# Patient Record
Sex: Male | Born: 1955 | Race: White | Hispanic: No | Marital: Married | Smoking: Never smoker
Health system: Southern US, Community
[De-identification: ages and names within clinical notes are randomized; demographics above are authoritative.]

---

## 2010-05-20 ENCOUNTER — Ambulatory Visit (HOSPITAL_COMMUNITY)
Admission: RE | Admit: 2010-05-20 | Discharge: 2010-05-20 | Disposition: A | Discharge status: Home / Self Care | Payer: 59 | Source: Ambulatory Visit | Attending: Orthopedic Surgery | Admitting: Orthopedic Surgery

## 2010-05-20 ENCOUNTER — Other Ambulatory Visit (HOSPITAL_COMMUNITY): Payer: Self-pay | Admitting: Orthopedic Surgery

## 2010-05-20 ENCOUNTER — Encounter (HOSPITAL_COMMUNITY)
Admission: RE | Admit: 2010-05-20 | Discharge: 2010-05-20 | Disposition: A | Discharge status: Home / Self Care | Payer: 59 | Source: Ambulatory Visit | Attending: Orthopedic Surgery | Admitting: Orthopedic Surgery

## 2010-05-20 DIAGNOSIS — Z96659 Presence of unspecified artificial knee joint: Secondary | ICD-10-CM

## 2010-05-20 DIAGNOSIS — Z01818 Encounter for other preprocedural examination: Secondary | ICD-10-CM | POA: Insufficient documentation

## 2010-05-20 LAB — PROTIME-INR
INR: 0.95 (ref 0.00–1.49)
Prothrombin Time: 12.9 seconds (ref 11.6–15.2)

## 2010-05-20 LAB — DIFFERENTIAL
Basophils Absolute: 0 10*3/uL (ref 0.0–0.1)
Basophils Relative: 1 % (ref 0–1)
Eosinophils Absolute: 0.1 10*3/uL (ref 0.0–0.7)
Eosinophils Relative: 1 % (ref 0–5)
Lymphs Abs: 1.3 10*3/uL (ref 0.7–4.0)
Neutrophils Relative %: 71 % (ref 43–77)

## 2010-05-20 LAB — URINALYSIS, ROUTINE W REFLEX MICROSCOPIC
Ketones, ur: NEGATIVE mg/dL
Nitrite: NEGATIVE
Protein, ur: NEGATIVE mg/dL
Urine Glucose, Fasting: NEGATIVE mg/dL

## 2010-05-20 LAB — COMPREHENSIVE METABOLIC PANEL
AST: 23 U/L (ref 0–37)
Alkaline Phosphatase: 88 U/L (ref 39–117)
BUN: 14 mg/dL (ref 6–23)
CO2: 32 mEq/L (ref 19–32)
Chloride: 101 mEq/L (ref 96–112)
Creatinine, Ser: 1.08 mg/dL (ref 0.4–1.5)
GFR calc non Af Amer: >60 mL/min (ref >60)
Potassium: 4.1 mEq/L (ref 3.5–5.1)
Total Bilirubin: 1.2 mg/dL (ref 0.3–1.2)

## 2010-05-20 LAB — CBC
Platelets: 190 10*3/uL (ref 150–400)
RBC: 4.67 MIL/uL (ref 4.22–5.81)
RDW: 13.4 % (ref 11.5–15.5)
WBC: 6.7 10*3/uL (ref 4.0–10.5)

## 2010-05-21 LAB — URINE CULTURE: Culture  Setup Time: 201202141533

## 2010-05-26 ENCOUNTER — Inpatient Hospital Stay (HOSPITAL_COMMUNITY)
Admission: RE | Admit: 2010-05-26 | Discharge: 2010-05-29 | DRG: 462 | Disposition: A | Discharge status: Home Health | Payer: 59 | Source: Ambulatory Visit | Attending: Orthopedic Surgery | Admitting: Orthopedic Surgery

## 2010-05-26 DIAGNOSIS — D62 Acute posthemorrhagic anemia: Secondary | ICD-10-CM | POA: Diagnosis not present

## 2010-05-26 DIAGNOSIS — K5900 Constipation, unspecified: Secondary | ICD-10-CM | POA: Diagnosis not present

## 2010-05-26 DIAGNOSIS — M1710 Unilateral primary osteoarthritis, unspecified knee: Principal | ICD-10-CM | POA: Diagnosis present

## 2010-05-26 DIAGNOSIS — K219 Gastro-esophageal reflux disease without esophagitis: Secondary | ICD-10-CM | POA: Diagnosis not present

## 2010-05-26 DIAGNOSIS — Z79899 Other long term (current) drug therapy: Secondary | ICD-10-CM

## 2010-05-26 DIAGNOSIS — Z472 Encounter for removal of internal fixation device: Secondary | ICD-10-CM

## 2010-05-26 LAB — ABO/RH: ABO/RH(D): O NEG

## 2010-05-26 LAB — GRAM STAIN

## 2010-05-27 LAB — BASIC METABOLIC PANEL
BUN: 13 mg/dL (ref 6–23)
CO2: 29 mEq/L (ref 19–32)
Chloride: 101 mEq/L (ref 96–112)
GFR calc non Af Amer: >60 mL/min (ref >60)
Glucose, Bld: 123 mg/dL — ABNORMAL HIGH (ref 70–99)
Potassium: 4.1 mEq/L (ref 3.5–5.1)
Sodium: 135 mEq/L (ref 135–145)

## 2010-05-27 LAB — CBC
HCT: 28.9 % — ABNORMAL LOW (ref 39.0–52.0)
Hemoglobin: 9.8 g/dL — ABNORMAL LOW (ref 13.0–17.0)
MCH: 31.1 pg (ref 26.0–34.0)
MCHC: 33.9 g/dL (ref 30.0–36.0)
MCV: 91.7 fL (ref 78.0–100.0)
RBC: 3.15 MIL/uL — ABNORMAL LOW (ref 4.22–5.81)

## 2010-05-27 NOTE — Op Note (Signed)
NAMEFRANCO, DULEY               ACCOUNT NO.:  [id number]  MEDICAL RECORD NO.:  [id number]           PATIENT TYPE:  I  LOCATION:  3307                         FACILITY:  [facility]  PHYSICIAN:  Elana Alm. Thurston Hole, M.D. DATE OF BIRTH:  Sep 22, 1955  DATE OF PROCEDURE:  05/26/2010 DATE OF DISCHARGE:                              OPERATIVE REPORT   PREOPERATIVE DIAGNOSIS:  Left knee degenerative joint disease.  POSTOPERATIVE DIAGNOSIS:  Left knee degenerative joint disease.  PROCEDURE: 1. Left total knee replacement using DePuy cemented total knee system     with #5 cemented femur, #5 cemented tibia with 12.5 mm polyethylene     RP tibial spacer, and 38-mm polyethylene cemented patella. 2. Zinacef-impregnated cement.  SURGEON:  Elana Alm. Thurston Hole, M.D.  ASSISTANT:  Feliberto Gottron. Turner Daniels, M.D. and Harvey Cedars PA.  ANESTHESIA:  General.  OPERATIVE TIME:  1 hour and 30 minutes, left knee.  COMPLICATIONS:  None.  DESCRIPTION OF PROCEDURE:  Mr. Plantz was brought to the operating room on May 26, 2010, after epidural anesthetic had been placed by Anesthesia in the holding room.  He was placed on the operative table in supine position.  After being placed under general anesthesia, both knees were examined under anesthesia.  He had range of motion from minus 10 to 105 degrees on the right and from minus 8 to 110 degrees on the left.  Knees were stable ligamentous exam with normal patellar tracking. He had a Foley catheter placed under sterile conditions and received Ancef 1 g IV preoperatively for prophylaxis.  Both legs were then prepped using sterile DuraPrep and draped using sterile technique.  Time- out procedure was called, and both right and left knees were identified as the operative correct knees.  Originally, the right knee underwent total knee replacement by Dr. Gean Birchwood, and I assisted Dr. Turner Daniels on the right knee, and he will dictate this operative note separately. After  the right knee total knee replacement was completed, the tourniquet was released.  Hemostasis was obtained with cautery and the incisions were closed.  Then, we proceeded with the left total knee replacement.  Left leg was exsanguinated and a thigh tourniquet elevated to 365 mm. Initially, through a 15-cm longitudinal incision based over the patella, initial exposure was made.  The underlying subcutaneous tissues were incised along with skin incision.  A median arthrotomy was performed, revealing an excessive amount of normal-appearing joint fluid.  The articular surfaces were inspected.  He had grade 4 changes medially, laterally, and in the patellofemoral joint.  Osteophytes removed from the femoral condyles and tibial plateau.  The medial and lateral meniscal remnants were removed as well as the anterior cruciate ligament.  An intramedullary drill was then drilled up the femoral canal for placement of distal femoral cutting jig, it was placed in the appropriate manner rotation, and a distal 10-mm cut was made.  The distal femur was incised, #5 was found to be the appropriate size.  A #5 cutting jig was placed in the appropriate manner of external rotation, and then these cuts were made.  The proximal tibia was then exposed.  The tibial spines were removed with an oscillating saw. Intramedullary drill was then drilled down the tibial canal for placement of proximal tibial cutting jig which was placed in appropriate manner rotation, and a proximal 4-mm cut was made, based off the medial or lower side.  Spacer blocks were then placed in flexion and extension; 12.5-mm blocks gave excellent balancing, excellent stability, and excellent correction of his flexion and varus deformities.  The #5 tibial baseplate trial was then placed on the cut tibial surface with an excellent fit, and the keel cut was made.  PCL box cutter was then placed on the distal femur, and these cuts were made.  After  this was done, #5 femoral trial was placed, and with the #5 tibial baseplate trial and a 12.5-mm polyethylene RP tibial spacer, knee was reduced, taken through range of motion from 0 to 125 degrees with excellent stability and excellent correction of his flexion and varus deformities, and normal patellar tracking.  A resurfacing 9-mm cut was then made on the patella and three locking holes placed for a 38-mm polyethylene patellar trial, and again, patellofemoral tracking was evaluated and found to be normal.  After this was done, it was felt that all trial components were of excellent size, fit, and stability.  They were then removed.  Knee was then jet lavaged, irrigated with 3 L of saline.  The proximal tibia was then exposed, and a #5 tibial baseplate with Zinacef- impregnated cement backing was hammered into position with an excellent fit, with excess cement being removed from around the edges.  A #5 femoral component with cement backing was hammered into position, also with an excellent fit, with excess cement being removed from around the edges.  The 12.5-mm polyethylene RP tibial spacer was placed on the tibial baseplate.  The knee reduced, taken through range of motion from 0 to 135 degrees with excellent stability and excellent correction of his flexion and varus deformities.  A 38-mm polyethylene, cement backed patella was then placed in its position and held there with a clamp. After the cement hardened again, patellofemoral tracking was evaluated and found to be normal.  At this point, it was felt that all the components were of excellent size, fit, and stability.  The wound was further irrigated with saline.  Tourniquet was released.  Hemostasis obtained with cautery.  The arthrotomy was then closed with a running #1 Ethibond suture.  Subcutaneous tissues closed with 0 and 2-0 Vicryl, subcuticular layer closed with 4-0 Monocryl.  Sterile dressings and long leg splint applied.   The patient then awakened, extubated, and taken to recovery room in stable condition.  Needle and sponge counts correct x2 at the end of the case.     Dawud Mays A. Thurston Hole, M.D.     RAW/MEDQ  D:  05/26/2010  T:  05/27/2010  Job:  [id number]  Electronically Signed by Salvatore Marvel M.D. on 05/27/2010 08:37:17 AM

## 2010-05-28 LAB — CBC
HCT: 22.8 % — ABNORMAL LOW (ref 39.0–52.0)
MCH: 31.6 pg (ref 26.0–34.0)
MCV: 91.2 fL (ref 78.0–100.0)
Platelets: 136 10*3/uL — ABNORMAL LOW (ref 150–400)
RDW: 13.4 % (ref 11.5–15.5)
WBC: 7.9 10*3/uL (ref 4.0–10.5)

## 2010-05-28 LAB — BASIC METABOLIC PANEL
BUN: 7 mg/dL (ref 6–23)
Chloride: 99 mEq/L (ref 96–112)
Creatinine, Ser: 0.96 mg/dL (ref 0.4–1.5)
GFR calc non Af Amer: >60 mL/min (ref >60)
Glucose, Bld: 133 mg/dL — ABNORMAL HIGH (ref 70–99)
Potassium: 4.1 mEq/L (ref 3.5–5.1)

## 2010-05-28 NOTE — Op Note (Signed)
Bryan Barrett, Bryan Barrett               ACCOUNT NO.:  [id number]  MEDICAL RECORD NO.:  [id number]           PATIENT TYPE:  I  LOCATION:  3307                         FACILITY:  [facility]  PHYSICIAN:  Feliberto Gottron. Turner Daniels, M.D.   DATE OF BIRTH:  11/03/1955  DATE OF PROCEDURE:  05/26/2010 DATE OF DISCHARGE:                              OPERATIVE REPORT   PREOPERATIVE DIAGNOSES:  End-stage arthritis of bilateral knees and also removed a retained hardware and staple from his old ligamentous reconstruction.  POSTOPERATIVE DIAGNOSIS:  End-stage arthritis of bilateral knees and also removed a retained hardware and staple from his old ligamentous reconstruction.  PROCEDURE:  Bilateral total knee arthroplasties, one on the right was done by myself and Dr. Wyline Mood assisted, the one on the left was done by Dr. Wyline Mood and I assisted.  I will dictate for the right knee.  Removal of staple right knee.  SURGEON:  Feliberto Gottron. Turner Daniels, MD  FIRST ASSISTANT:  Sandria Bales, MD  ANESTHESIA:  General endotracheal.  ESTIMATED BLOOD LOSS:  200 mL.  FLUID REPLACEMENT:  1800 mL crystalloid.  DRAINS PLACED:  Autovac and Foley catheter.  URINE OUTPUT:  About 300 mL.  TOURNIQUET TIME:  On the right, was an hour and 15 minutes.  INDICATIONS FOR PROCEDURE:  A 55 year old gentleman with end-stage arthritis of right and left knee.  On the right side, he had post- traumatic arthritis for many years ago.  He had some sort of an extra- articular ligamentous reconstruction, has huge medial, lateral, anterior and posterior osteophytes down to bone-on-bone with lateral subluxation of the tibia beneath the femur.  His collateral ligaments were stable on clinical examination, has about a 10 degrees flexion contracture and can bend to about 100 degrees.  He has failed conservative measures over the years with anti-inflammatory medicines, physical therapy, cortisone injections, and Viscoat supplementation, and now desires  elective right total knee arthroplasty, and we are prepared to use ligament substituting prosthetics if necessary.  Risks and benefits of surgery have been discussed, questions answered.  DESCRIPTION OF PROCEDURE:  The patient identified by armband, received preoperative IV antibiotics in the holding area, [facility] was well as epidural block.  He was then taken to the operating room 8, appropriate anesthetic monitors were attached, and general endotracheal anesthesia  was induced with the patient in supine position.  Foley catheter was inserted.  Tourniquet was applied high to both thighs. Both lower extremities were then prepped and draped in usual sterile fashion from the ankle to the tourniquet.  Lateral post and foot positioners were applied to the table.  After a sterile prep and drape, time-out procedure was performed, and we began on the right side by wrapping limb with an Esmarch bandage, inflating it to 350 mmHg with the knee bent.  An anterior midline incision starting handbreadth over the patella going over the patella and then inferiorly to where it joined up with an old medial parapatellar incision and distally for about 4 cm distal to the tibial tubercle was made through the skin and subcutaneous tissue down the level of transverse retinaculum, which was incised and  reflected medially.  We then performed a medial parapatellar arthrotomy, everted the patella, resected the prepatellar fat pad, elevated the superficial medial collateral ligament from anterior to posterior off the proximal tibia, and I also removed a retained hardware and staple from his old ligamentous reconstruction.  After the staple had been removed, the patella was everted then he was hyperflexed, and we set about removing a huge medial and lateral osteophytes that were about anywhere from a 1/4 inch to 1/2 inch in width around the femur and fairly drastically downsized the size of the femur and  the proximal tibia along the medial side where again there were huge osteophytes going out about a 1/4 inch to a 1/2 inch.  Once these had been removed, we removed notch osteophytes, the notch was completely stenotic and identified the PCL and removed that as well.  We then removed what was left to the tibial spines, although they were ground down to bare bone and entered the proximal tibia with the step drill keeping the knee hyperflexed.  This was followed by the IM rod and 2-degree posterior slope cutting guide, and we removed a fair amount of bone anteriorly, but posteriorly where the tibia dished out, we cut down to the level of the bone line.  Once this had been accomplished, we then removed osteophytes in the posterior aspect of the tibia as well.  We then entered the distal femur 2 mm anterior to the PCL origin with a step drill followed by the IM rod, and a 5-degree right distal femoral cutting guide was then pinned along the epicondylar axis allowing a 10- mm distal femoral cut.  We then used the anterior sizing guide set up for a 5 distal femur externally rotated 3 degrees, hammered into place the chamfer cutting guide, performed anterior and posterior chamfer cuts without difficulty followed by the Sigma RP box cut.  Once this had been accomplished, we finally had the knee loosened up enough where we could get posterior and superior where the patient had huge osteophytes, and these were removed with a curved 3/4 inch osteotome and removed piecemeal along with multiple osteochondral loose bodies.  At this point, the knee was then brought into full extension and our gap was good for a 10-mm Sigma RP bearing.  We also removed remnants of the menisci at this point, measured the patella 28 mm, set the cutting guide at 14, and remove the posterior 14 mm of the patella, sized for a 38 button and drilled.  Again, the knee was hyperflexed exposing the proximal tibia.  We brought it  forward with a Veterinary surgeon and a lateral Hohmann, and a posteromedial Z retractor.  We sized for a six tibial baseplate, pinned it in place, brought up the smokestack conical reamer and Delta fin keel punch.  We then hammered into place a 5 right trial femur, drilled the lugs, inserted a 10-mm Sigma RP bearing and brought the knee to full extension and good stability was noted.  The patella tracked with no thumb pressure.  At this point, the trial components were removed, all bony surfaces were water picked, cleaned, dried with suction and sponges.  Double batch of DePuy HV cement with 1500 mg of Zinacef was then mixed and applied to all bony and metallic mating surfaces except for the posterior condyles of the femur itself. In order, we hammered into place a #6 tibial tray and removed the excess cement, a 5 right femoral component and removed the excess  cement.  We inserted a 10-mm Sigma RP bearing, reduced the knee, brought to full extension, and kept in compression while the cement hardened, 38 patellar button was then squeezed into place and excess cement removed. We then pulse lavage wound one more time, inserted Autovac drain and checked our stability one more time after the cement had hardened.  The tourniquet was let down.  Small bleeders identified and cauterized.  We then closed in layers with running #1 Vicryl suture, subcutaneously 0-0 and 2-0 undyed Vicryl suture, and subcuticular 4-0 Monocryl suture.  Dressing of Xeroform, 4 x 4 dressing, sponges, Webril, and Ace wrap then applied, and as the closure was occurring Dr. Wyline Mood began on the left knee with myself assisting and Kirstin Shepperson, PA-C actually closed the right wound in layers.     Feliberto Gottron. Turner Daniels, M.D.     Ovid Curd  D:  05/26/2010  T:  05/27/2010  Job:  [id number]  Electronically Signed by Gean Birchwood M.D. on 05/28/2010 06:02:55 PM

## 2010-05-29 LAB — BASIC METABOLIC PANEL
CO2: 34 mEq/L — ABNORMAL HIGH (ref 19–32)
Chloride: 96 mEq/L (ref 96–112)
GFR calc Af Amer: >60 mL/min (ref >60)
Glucose, Bld: 133 mg/dL — ABNORMAL HIGH (ref 70–99)
Sodium: 136 mEq/L (ref 135–145)

## 2010-05-29 LAB — TISSUE CULTURE

## 2010-05-29 LAB — CBC
HCT: 25.9 % — ABNORMAL LOW (ref 39.0–52.0)
MCH: 30.7 pg (ref 26.0–34.0)
MCV: 89.3 fL (ref 78.0–100.0)
RBC: 2.9 MIL/uL — ABNORMAL LOW (ref 4.22–5.81)
WBC: 6.2 10*3/uL (ref 4.0–10.5)

## 2010-05-29 LAB — TYPE AND SCREEN
ABO/RH(D): O NEG
Unit division: 0

## 2010-05-31 LAB — ANAEROBIC CULTURE

## 2010-06-12 NOTE — Discharge Summary (Signed)
Bryan Barrett, Bryan Barrett               ACCOUNT NO.:  [id number]  MEDICAL RECORD NO.:  [id number]           PATIENT TYPE:  I  LOCATION:  5024                         FACILITY:  [facility]  PHYSICIAN:  Baldomero Mirarchi A. Thurston Hole, M.D. DATE OF BIRTH:  03-19-1956  DATE OF ADMISSION:  05/26/2010 DATE OF DISCHARGE:  05/29/2010                              DISCHARGE SUMMARY   ADMITTING DIAGNOSIS:  End-stage degenerative joint disease, both knees, osteoarthritis.  DISCHARGE DIAGNOSES: 1. End-stage degenerative joint disease, both knee status post     bilateral total knee replacement. 2. Constipation. 3. Acute blood loss anemia.  PROCEDURES IN-HOUSE:  On May 26, 2010, the patient underwent bilateral total knee replacements by Dr. Thurston Hole.  He had an indwelling epidural catheter inserted by Anesthesia that was removed on May 27, 2010.  He had bilateral Autovac transfusion on May 26, 2010.  HOSPITAL COURSE:  The patient was placed in bilateral CPMs in the recovery room, tolerated these well.  He was sent to step-down due to some hypotension that we feel is the result of the epidural catheters. He just needed high-level monitoring.  He spent overnight in step-down, he did very well with this.  Epidural catheter was turned off at 7 a.m. The patient was alert and oriented postop day 1.  Surgical wounds were well approximated.  He was able to stand.  Epidural catheter was removed.  He was transferred to the Orthopedic floor, physical therapy was begun.  At 8 p.m. postop day 1, the patient's Lovenox was started, it was delayed due to the indwelling epidural catheter.  On postop day 2, the patient was alert and oriented, complaining of some constipation. His hemoglobin was low at 7.8, he was transfused 2 units of packed red blood cells that is on May 28, 2010.  He tolerated the transfusion well.  He ambulated approximately 50 feet and did stairs on postop day 2.  On postop day 3, the patient  is alert and oriented.  Hemoglobin is 8.9.  He is metabolically stable.  He is being discharged to home in stable condition.  Weightbearing as tolerated with Home Health Physical Therapy, Home Health Occupational Therapy.  DISCHARGE MEDICATIONS: 1. Lovenox 30 mg subcu b.i.d. 2. Colace 100 mg 1 tablet twice a day while on narcotics. 3. Artificial tears 1 drop daily in each eye as needed. 4. Percocet 5/325 one tablet q.4-6 h. as needed for breakthrough pain. 5. Oxycodone 20 mg sustained release 1 tablet p.o. q.8 h.  He has been     instructed to stop his Advil, stop his Naprosyn, stop his     glucosamine.  He will get physical therapy 3-4 times a week with Home Health.  He is weightbearing as tolerated.  He is on a regular diet.  He will follow up with Dr. Thurston Hole on June 09, 2010, for followup.  He has been instructed to call our office with increased pain, increased swelling, increased redness, or temperature greater than 101.  His vital signs are stable. He is afebrile at discharge.     Kirstin Shepperson, PA-C   ______________________________ Elana Alm Thurston Hole, M.D.  KS/MEDQ  D:  05/29/2010  T:  05/29/2010  Job:  [id number]  Electronically Signed by Julien Girt P.A. on 06/09/2010 03:47:10 PM Electronically Signed by Salvatore Marvel M.D. on 06/11/2010 02:02:37 PM

## 2014-08-21 ENCOUNTER — Other Ambulatory Visit: Payer: Self-pay | Admitting: Sports Medicine

## 2014-08-21 DIAGNOSIS — M5136 Other intervertebral disc degeneration, lumbar region: Secondary | ICD-10-CM

## 2014-08-21 DIAGNOSIS — M51369 Other intervertebral disc degeneration, lumbar region without mention of lumbar back pain or lower extremity pain: Secondary | ICD-10-CM

## 2014-08-22 ENCOUNTER — Other Ambulatory Visit: Payer: Self-pay

## 2014-08-23 ENCOUNTER — Ambulatory Visit
Admission: RE | Admit: 2014-08-23 | Discharge: 2014-08-23 | Disposition: A | Discharge status: Home / Self Care | Payer: 59 | Source: Ambulatory Visit | Attending: Sports Medicine | Admitting: Sports Medicine

## 2014-08-23 DIAGNOSIS — M5136 Other intervertebral disc degeneration, lumbar region: Secondary | ICD-10-CM

## 2014-08-23 MED ORDER — METHYLPREDNISOLONE ACETATE 40 MG/ML INJ SUSP (RADIOLOG
120.0000 mg | Freq: Once | INTRAMUSCULAR | Status: AC
Start: 2014-08-23 — End: 2014-08-23
  Administered 2014-08-23: 120 mg via EPIDURAL

## 2014-08-23 MED ORDER — IOHEXOL 180 MG/ML  SOLN
1.0000 mL | Freq: Once | INTRAMUSCULAR | Status: AC | PRN
  Administered 2014-08-23: 1 mL via EPIDURAL

## 2014-08-23 NOTE — Discharge Instructions (Signed)

## 2014-11-07 ENCOUNTER — Other Ambulatory Visit: Payer: Self-pay | Admitting: Sports Medicine

## 2014-11-07 DIAGNOSIS — M5136 Other intervertebral disc degeneration, lumbar region: Secondary | ICD-10-CM

## 2014-11-09 ENCOUNTER — Ambulatory Visit
Admission: RE | Admit: 2014-11-09 | Discharge: 2014-11-09 | Disposition: A | Discharge status: Home / Self Care | Payer: 59 | Source: Ambulatory Visit | Attending: Sports Medicine | Admitting: Sports Medicine

## 2014-11-09 DIAGNOSIS — M5136 Other intervertebral disc degeneration, lumbar region: Secondary | ICD-10-CM

## 2014-11-09 MED ORDER — METHYLPREDNISOLONE ACETATE 40 MG/ML INJ SUSP (RADIOLOG
120.0000 mg | Freq: Once | INTRAMUSCULAR | Status: AC
  Administered 2014-11-09: 120 mg via EPIDURAL

## 2014-11-09 MED ORDER — IOHEXOL 180 MG/ML  SOLN
1.0000 mL | Freq: Once | INTRAMUSCULAR | Status: AC | PRN
  Administered 2014-11-09: 1 mL via EPIDURAL

## 2014-11-09 NOTE — Discharge Instructions (Signed)

## 2016-03-05 ENCOUNTER — Other Ambulatory Visit: Payer: Self-pay | Admitting: Neurological Surgery

## 2016-03-05 DIAGNOSIS — M5416 Radiculopathy, lumbar region: Secondary | ICD-10-CM

## 2016-03-06 ENCOUNTER — Ambulatory Visit
Admission: RE | Admit: 2016-03-06 | Discharge: 2016-03-06 | Disposition: A | Discharge status: Home / Self Care | Payer: 59 | Source: Ambulatory Visit | Attending: Neurological Surgery | Admitting: Neurological Surgery

## 2016-03-06 DIAGNOSIS — M5416 Radiculopathy, lumbar region: Secondary | ICD-10-CM

## 2016-03-06 MED ORDER — METHYLPREDNISOLONE ACETATE 40 MG/ML INJ SUSP (RADIOLOG
120.0000 mg | Freq: Once | INTRAMUSCULAR | Status: AC
  Administered 2016-03-06: 120 mg via EPIDURAL

## 2016-03-06 MED ORDER — IOPAMIDOL (ISOVUE-M 200) INJECTION 41%
1.0000 mL | Freq: Once | INTRAMUSCULAR | Status: AC
  Administered 2016-03-06: 1 mL via EPIDURAL

## 2016-03-06 NOTE — Discharge Instructions (Signed)

## 2016-04-10 DIAGNOSIS — R42 Dizziness and giddiness: Secondary | ICD-10-CM | POA: Insufficient documentation

## 2016-12-21 DIAGNOSIS — N138 Other obstructive and reflux uropathy: Secondary | ICD-10-CM | POA: Insufficient documentation

## 2016-12-21 DIAGNOSIS — N200 Calculus of kidney: Secondary | ICD-10-CM | POA: Insufficient documentation

## 2016-12-21 DIAGNOSIS — N401 Enlarged prostate with lower urinary tract symptoms: Secondary | ICD-10-CM

## 2017-10-26 DIAGNOSIS — M48061 Spinal stenosis, lumbar region without neurogenic claudication: Secondary | ICD-10-CM | POA: Insufficient documentation

## 2018-01-03 DIAGNOSIS — N3281 Overactive bladder: Secondary | ICD-10-CM | POA: Insufficient documentation

## 2018-04-18 ENCOUNTER — Other Ambulatory Visit: Payer: Self-pay | Admitting: Neurological Surgery

## 2018-04-18 DIAGNOSIS — M5416 Radiculopathy, lumbar region: Secondary | ICD-10-CM

## 2018-04-26 ENCOUNTER — Other Ambulatory Visit: Payer: 59

## 2018-04-28 ENCOUNTER — Ambulatory Visit
Admission: RE | Admit: 2018-04-28 | Discharge: 2018-04-28 | Disposition: A | Discharge status: Home / Self Care | Payer: 59 | Source: Ambulatory Visit | Attending: Neurological Surgery | Admitting: Neurological Surgery

## 2018-04-28 DIAGNOSIS — M5416 Radiculopathy, lumbar region: Secondary | ICD-10-CM

## 2018-04-28 MED ORDER — ONDANSETRON HCL 4 MG/2ML IJ SOLN: 4.0000 mg | Freq: Four times a day (QID) | INTRAMUSCULAR | Status: DC | PRN

## 2018-04-28 MED ORDER — DIAZEPAM 5 MG PO TABS
10.0000 mg | ORAL_TABLET | Freq: Once | ORAL | Status: AC
  Administered 2018-04-28: 5 mg via ORAL

## 2018-04-28 MED ORDER — IOPAMIDOL (ISOVUE-M 200) INJECTION 41%
20.0000 mL | Freq: Once | INTRAMUSCULAR | Status: AC
  Administered 2018-04-28: 20 mL via INTRATHECAL

## 2018-04-28 NOTE — Discharge Instructions (Signed)

## 2018-05-04 ENCOUNTER — Other Ambulatory Visit: Payer: Self-pay | Admitting: Neurological Surgery

## 2018-05-04 DIAGNOSIS — M5416 Radiculopathy, lumbar region: Secondary | ICD-10-CM

## 2018-05-12 ENCOUNTER — Ambulatory Visit
Admission: RE | Admit: 2018-05-12 | Discharge: 2018-05-12 | Disposition: A | Discharge status: Home / Self Care | Payer: 59 | Source: Ambulatory Visit | Attending: Neurological Surgery | Admitting: Neurological Surgery

## 2018-05-12 DIAGNOSIS — M5416 Radiculopathy, lumbar region: Secondary | ICD-10-CM

## 2018-05-12 MED ORDER — IOPAMIDOL (ISOVUE-M 200) INJECTION 41%
1.0000 mL | Freq: Once | INTRAMUSCULAR | Status: AC
  Administered 2018-05-12: 1 mL via EPIDURAL

## 2018-05-12 MED ORDER — METHYLPREDNISOLONE ACETATE 40 MG/ML INJ SUSP (RADIOLOG
120.0000 mg | Freq: Once | INTRAMUSCULAR | Status: AC
  Administered 2018-05-12: 120 mg via EPIDURAL

## 2019-04-12 IMAGING — XA Imaging study
4 series · 4 of 4 positions shown · non-contrast
Comparison: none

CLINICAL DATA: Lumbosacral spondylosis without myelopathy.
Predominantly left-sided low back pain radiating to the left leg.
Good response to prior L4-L5 interlaminar epidural injections on the
right. Left L4-L5 injection requested today.

[Series 4: ortho standard · 1 of 1 slices shown (1 of 4)]
[im 1/1]
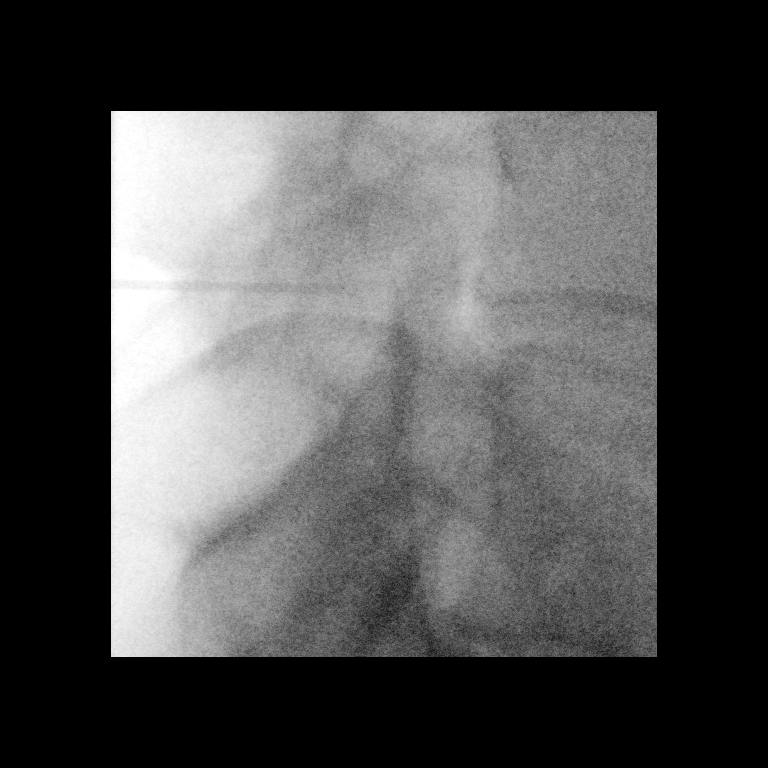

[Series 5: ortho standard · 1 of 1 slices shown (2 of 4)]
[im 1/1]
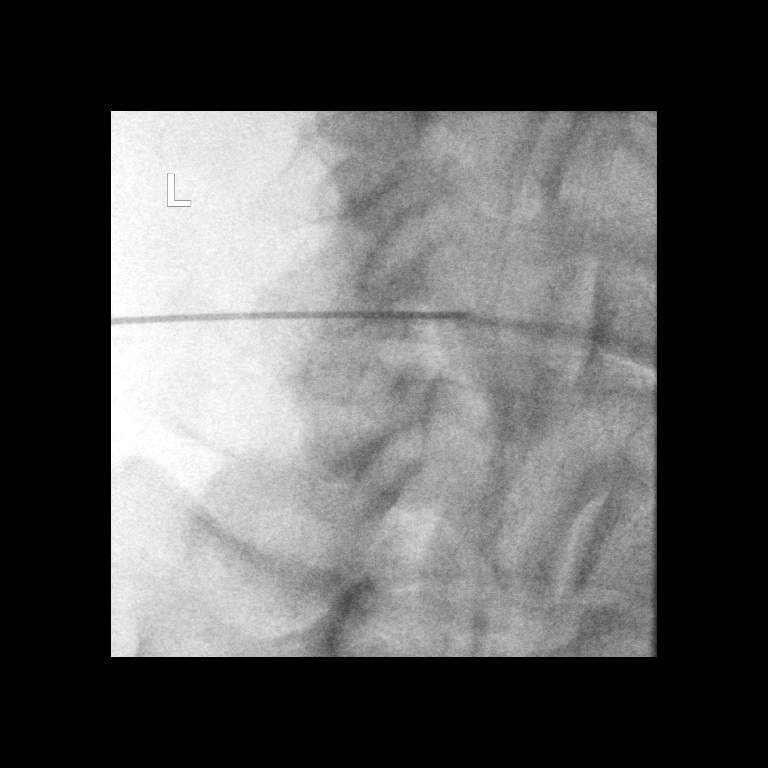

[Series 6: ortho standard · 1 of 1 slices shown (3 of 4)]
[im 1/1]
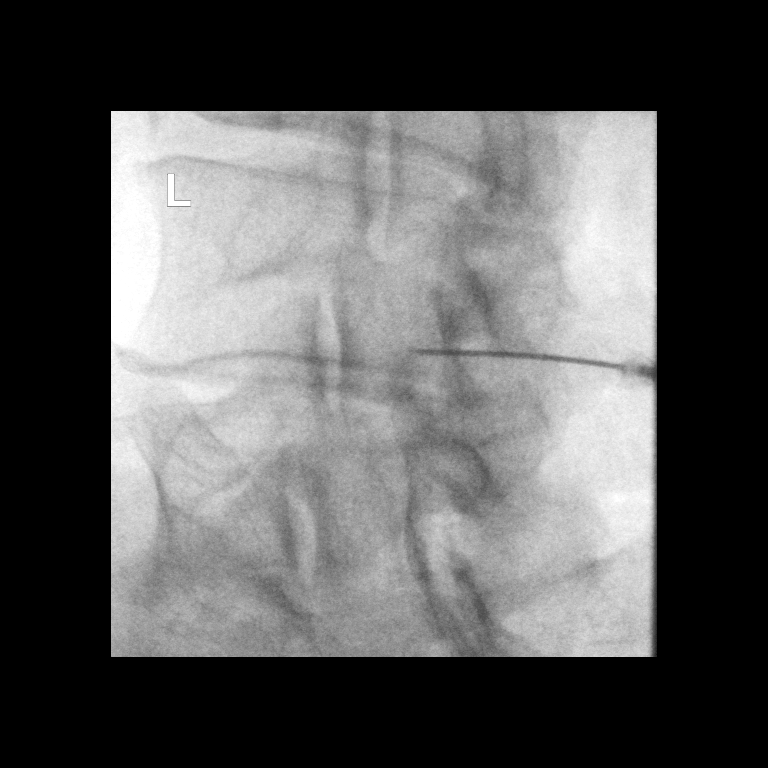

[Series 7: ortho standard · 1 of 1 slices shown (4 of 4)]
[im 1/1]
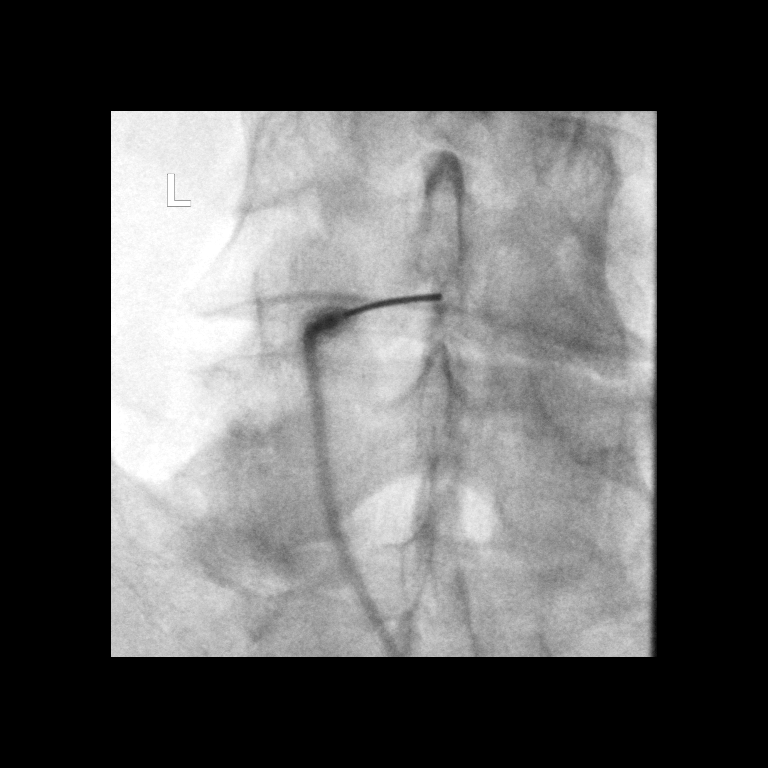

[4 of 4 positions shown; findings below may reference images not displayed]

FLUOROSCOPY TIME:  Radiation Exposure Index (as provided by the
fluoroscopic device): 5.6 mGy

Number of Acquired Images:  0

PROCEDURE:
The procedure, risks, benefits, and alternatives were explained to
the patient. Questions regarding the procedure were encouraged and
answered. The patient understands and consents to the procedure.

LUMBAR EPIDURAL INJECTION:

An interlaminar approach was performed on the left at L4-L5. The
overlying skin was cleansed and anesthetized. A 3.5 inch 20 gauge
epidural needle was advanced using loss-of-resistance technique.

DIAGNOSTIC EPIDURAL INJECTION:

Initial injection of Isovue-M 200 shows good epidural pattern with
spread above and below the level of needle placement, primarily in
the midline and to the right. No vascular opacification is seen.

THERAPEUTIC EPIDURAL INJECTION:

120 mg of Depo-Medrol mixed with 3 mL of 1% lidocaine were
instilled. The procedure was well-tolerated, and the patient was
discharged thirty minutes following the injection in good condition.

COMPLICATIONS:
None immediate.
IMPRESSION: Technically successful epidural injection on the left at L4-L5 #1.

## 2020-03-06 ENCOUNTER — Ambulatory Visit (HOSPITAL_BASED_OUTPATIENT_CLINIC_OR_DEPARTMENT_OTHER): Admit: 2020-03-06 | Payer: 59 | Admitting: Orthopaedic Surgery

## 2020-03-06 ENCOUNTER — Encounter (HOSPITAL_BASED_OUTPATIENT_CLINIC_OR_DEPARTMENT_OTHER): Payer: Self-pay

## 2020-03-06 SURGERY — REPAIR, ROTATOR CUFF, ARTHROSCOPIC
Anesthesia: Choice | Site: Shoulder | Laterality: Right

## 2024-02-01 ENCOUNTER — Other Ambulatory Visit: Payer: Self-pay | Admitting: Orthopaedic Surgery

## 2024-02-01 DIAGNOSIS — S46012A Strain of muscle(s) and tendon(s) of the rotator cuff of left shoulder, initial encounter: Secondary | ICD-10-CM

## 2024-02-21 ENCOUNTER — Ambulatory Visit
Admission: RE | Admit: 2024-02-21 | Discharge: 2024-02-21 | Disposition: A | Source: Ambulatory Visit | Attending: Orthopaedic Surgery

## 2024-02-21 DIAGNOSIS — S46012A Strain of muscle(s) and tendon(s) of the rotator cuff of left shoulder, initial encounter: Secondary | ICD-10-CM

## 2024-04-07 ENCOUNTER — Other Ambulatory Visit: Payer: Self-pay | Admitting: Neurological Surgery

## 2024-04-07 DIAGNOSIS — M5416 Radiculopathy, lumbar region: Secondary | ICD-10-CM

## 2024-04-11 ENCOUNTER — Ambulatory Visit
Admission: RE | Admit: 2024-04-11 | Discharge: 2024-04-11 | Disposition: A | Source: Ambulatory Visit | Attending: Neurological Surgery | Admitting: Neurological Surgery

## 2024-04-11 DIAGNOSIS — M5416 Radiculopathy, lumbar region: Secondary | ICD-10-CM

## 2024-04-11 MED ORDER — METHYLPREDNISOLONE ACETATE 40 MG/ML INJ SUSP (RADIOLOG
80.0000 mg | Freq: Once | INTRAMUSCULAR | Status: AC
  Administered 2024-04-11: 80 mg via EPIDURAL

## 2024-04-11 MED ORDER — IOPAMIDOL (ISOVUE-M 200) INJECTION 41%
1.0000 mL | Freq: Once | INTRAMUSCULAR | Status: AC
  Administered 2024-04-11: 1 mL via EPIDURAL

## 2024-04-11 NOTE — Discharge Instructions (Signed)
